# Patient Record
Sex: Male | Born: 2000
Health system: Southern US, Community
[De-identification: ages and names within clinical notes are randomized; demographics above are authoritative.]

---
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
## 2018-08-14 DIAGNOSIS — Z113 Encounter for screening for infections with a predominantly sexual mode of transmission: Secondary | ICD-10-CM | POA: Diagnosis not present

## 2019-11-18 ENCOUNTER — Telehealth: Payer: Self-pay

## 2019-11-18 NOTE — Telephone Encounter (Signed)
[facility] Night - Client Nonclinical Telephone Record AccessNurse Client [facility] Night - Client Client Site [facility] - Night Contact Type Call Who Is Calling Patient / Member / Family / Caregiver Caller Name Kelii Chittum Phone Number [phone] Patient Name Samuel Chandler Patient DOB 20-May-2001 Call Type Message Only Information Provided Reason for Call Request to Schedule Office Appointment Initial Comment Caller states they are calling to schedule an appointment. Additional Comment Disp. Time Disposition Final User 11/17/2019 5:40:10 PM General Information Provided Yes Carley Hammed Call Closed By: Carley Hammed Transaction Date/Time: 11/17/2019 5:34:05 PM (ET)

## 2019-11-18 NOTE — Telephone Encounter (Signed)
lvm asking pt to call office °

## 2019-12-13 ENCOUNTER — Ambulatory Visit: Payer: BLUE CROSS/BLUE SHIELD | Admitting: Primary Care

## 2019-12-16 ENCOUNTER — Ambulatory Visit: Payer: BLUE CROSS/BLUE SHIELD | Admitting: Primary Care

## 2019-12-16 DIAGNOSIS — Z0289 Encounter for other administrative examinations: Secondary | ICD-10-CM

## 2020-04-14 ENCOUNTER — Emergency Department (HOSPITAL_BASED_OUTPATIENT_CLINIC_OR_DEPARTMENT_OTHER): Payer: BC Managed Care – PPO

## 2020-04-14 ENCOUNTER — Encounter (HOSPITAL_BASED_OUTPATIENT_CLINIC_OR_DEPARTMENT_OTHER): Payer: Self-pay | Admitting: *Deleted

## 2020-04-14 ENCOUNTER — Other Ambulatory Visit: Payer: Self-pay

## 2020-04-14 ENCOUNTER — Emergency Department (HOSPITAL_BASED_OUTPATIENT_CLINIC_OR_DEPARTMENT_OTHER)
Admission: EM | Admit: 2020-04-14 | Discharge: 2020-04-14 | Disposition: A | Discharge status: Home / Self Care | Payer: BC Managed Care – PPO | Attending: Emergency Medicine | Admitting: Emergency Medicine

## 2020-04-14 DIAGNOSIS — R52 Pain, unspecified: Secondary | ICD-10-CM

## 2020-04-14 DIAGNOSIS — M79605 Pain in left leg: Secondary | ICD-10-CM | POA: Diagnosis not present

## 2020-04-14 NOTE — ED Provider Notes (Signed)
[facility] EMERGENCY DEPARTMENT Provider Note   CSN: [id number] Arrival date & time: 04/14/20  1803     History Chief Complaint  Patient presents with  . Leg Pain    Samuel Chandler is a 19 y.o. male presents for evaluation of left lower extremity pain that has been ongoing for the last several months.  Patient reports that several months ago, he started noticing pain in his left leg.  He states that it is hard to describe but he feels like "the leg constantly needs to be stretched."  He states that he will feel pain in his thigh as well into the knee and upper tib-fib area.  He states that he has not noticed any overlying warmth, erythema, edema.  He denies any preceding trauma, injury, fall.  He states that he still been able to ambulate.  He notes that this pain is worse when he tries to exercise.  He has not sought evaluation for this yet.  He states that the pain persisted which is what brought him to the emergency department today.  He has not taken a medications.  He states that occasionally, he will get tingling in the leg but states that he never has any full numbness to where he cannot feel anything.  He has not had any fevers.  He currently denies any pain in his left arm.  No weakness. He denies any exogenous hormone use, recent immobilization, prior history of DVT/PE, recent surgery, leg swelling, or long travel.  The history is provided by the patient.       History reviewed. No pertinent past medical history.  There are no problems to display for this patient.   History reviewed. No pertinent surgical history.     No family history on file.  Social History   Tobacco Use  . Smoking status: Never Smoker  . Smokeless tobacco: Never Used  Substance Use Topics  . Alcohol use: Yes    Comment: occ  . Drug use: Yes    Types: Marijuana    Home Medications Prior to Admission medications   Medication Sig Start Date End Date Taking? Authorizing Provider    cephALEXin (KEFLEX) 500 MG capsule Take 1 capsule (500 mg total) by mouth 3 (three) times daily. 08/28/17   Rolland Porter, MD  HYDROcodone-acetaminophen (NORCO/VICODIN) 5-325 MG tablet Take 1 tablet by mouth every 4 (four) hours as needed. 08/28/17   Rolland Porter, MD    Allergies    Patient has no known allergies.  Review of Systems   Review of Systems  Constitutional: Negative for fever.  Cardiovascular: Negative for leg swelling.  Musculoskeletal:       LLE pain  Skin: Negative for color change.  Neurological: Negative for weakness and numbness.  All other systems reviewed and are negative.   Physical Exam Updated Vital Signs BP 118/73   Pulse 71   Temp 98.2 F (36.8 C) (Oral)   Resp 16   Ht 5\' 10"  (1.778 m)   Wt 62.4 kg   SpO2 99%   BMI 19.74 kg/m   Physical Exam Vitals and nursing note reviewed.  Constitutional:      Appearance: He is well-developed.  HENT:     Head: Normocephalic and atraumatic.  Eyes:     General: No scleral icterus.       Right eye: No discharge.        Left eye: No discharge.     Conjunctiva/sclera: Conjunctivae normal.  Cardiovascular:  Pulses:          Dorsalis pedis pulses are 2+ on the right side and 2+ on the left side.  Pulmonary:     Effort: Pulmonary effort is normal.  Musculoskeletal:     Comments: No pelvic instability.  No acute bony tenderness noted to the left hip.  Full flexion/tension and internal/external rotation of left hip intact with any difficulty.  Diffuse muscular tenderness noted to the left thigh, left  proximal tib-fib.  No deformity or crepitus noted.  Flexion/tension of left knee intact without difficulty.  Negative anterior and posterior drawer test.  No instability noted with varus or valgus stress.  No bony tenderness noted distal tib-fib, ankle.  No tenderness palpation of right lower extremity.  No tenderness palpation of bilateral upper extremities.  Skin:    General: Skin is warm and dry.     Comments: Good  distal cap refill.  LLE is not dusky in appearance or cool to touch.  Neurological:     Mental Status: He is alert.     Comments: Cranial nerves III-XII intact Follows commands, Moves all extremities  5/5 strength to BUE and BLE  Sensation intact throughout all major nerve distributions No gait abnormalities  No slurred speech. No facial droop.   Psychiatric:        Speech: Speech normal.        Behavior: Behavior normal.     ED Results / Procedures / Treatments   Labs (all labs ordered are listed, but only abnormal results are displayed) Labs Reviewed - No data to display  EKG None  Radiology DG FEMUR MIN 2 VIEWS LEFT  Result Date: 04/14/2020 CLINICAL DATA:  Intermittent atraumatic left thigh discomfort x several months. EXAM: LEFT FEMUR 2 VIEWS COMPARISON:  None. FINDINGS: There is no evidence of fracture or other focal bone lesions. Soft tissues are unremarkable. IMPRESSION: Negative. Electronically Signed   By: Aram Candela M.D.   On: 04/14/2020 23:00    Procedures Procedures (including critical care time)  Medications Ordered in ED Medications - No data to display  ED Course  I have reviewed the triage vital signs and the nursing notes.  Pertinent labs & imaging results that were available during my care of the patient were reviewed by me and considered in my medical decision making (see chart for details).    MDM Rules/Calculators/A&P                          19 year old male who presents for evaluation of left leg pain that has been ongoing for several months.  He states that he feels like "something is pulling on his muscle."  No trauma, injury, fall.  He has been able to ambulate.  He states that that sometimes it hurts in the thigh, and the hip is about on the top part of his knee.  He has not sought evaluation for this.  On initially arrival, he is afebrile nontoxic-appearing.  Vital signs are stable.  He is neurovascularly intact.  On exam, he has diffuse  muscular tenderness in the thigh region.  No deformity or crepitus noted.  No evidence of warmth, erythema.  Exam not concerning for septic arthritis, ischemic limb.  Doubt DVT.  He does not have any DVT risk factors.  He states that he is concerned for a blood clot.  We will plan for x-rays.  Patient has no evidence of neurological deficits.  He has no gait  abnormalities.  History/local exam not concerning for CVA or intracranial abnormality.  No negation for CT head.  X-ray reviewed.  Negative for any acute bony abnormality.  Discussed results with patient.  At this time, patient is hemodynamically stable.  He is concerned about a blood clot.  At this time, we do not have ultrasound available at this facility.  I did discuss with him that he is low risk and does not have any signs concerning for a DVT.  Will order an outpatient ultrasound for further evaluation.  I discussed with him that there still could be a musculoskeletal/ligamentous injury that could be contributing to his symptoms.  We will give him outpatient orthopedics for follow-up. At this time, patient exhibits no emergent life-threatening condition that require further evaluation in ED or admission. Patient had ample opportunity for questions and discussion. All patient's questions were answered with full understanding. Strict return precautions discussed. Patient expresses understanding and agreement to plan.   Portions of this note were generated with Scientist, clinical (histocompatibility and immunogenetics). Dictation errors may occur despite best attempts at proofreading.  Final Clinical Impression(s) / ED Diagnoses Final diagnoses:  Left leg pain    Rx / DC Orders ED Discharge Orders         Ordered    US Venous Img Lower Unilateral Left        04/14/20 2321           Maxwell Caul, PA-C 04/14/20 2331    Milagros Loll, MD 04/17/20 [phone]

## 2020-04-14 NOTE — Discharge Instructions (Signed)
You can take Tylenol or Ibuprofen as directed for pain. You can alternate Tylenol and Ibuprofen every 4 hours. If you take Tylenol at 1pm, then you can take Ibuprofen at 5pm. Then you can take Tylenol again at 9pm.   As discussed, your x-rays today were reassuring.  As discussed, there still could be muscle, ligament injury.  He can follow-up with referred to orthopedics for further evaluation.  Ordered outpatient ultrasound that can be done tomorrow.  He can return between the hours of between 10 AM-11 AM to o get it done.   Return to emergency department for any worsening pain, numbness/weakness of the leg, redness or swelling of the leg or any other worsening or concerning symptoms.

## 2020-04-14 NOTE — ED Triage Notes (Signed)
Left side of his body has not felt right for several months. Ambulatory in no distress.

## 2020-04-15 ENCOUNTER — Ambulatory Visit (HOSPITAL_BASED_OUTPATIENT_CLINIC_OR_DEPARTMENT_OTHER)
Admission: RE | Admit: 2020-04-15 | Discharge: 2020-04-15 | Disposition: A | Discharge status: Home / Self Care | Payer: BC Managed Care – PPO | Source: Ambulatory Visit | Attending: Emergency Medicine | Admitting: Emergency Medicine

## 2020-04-15 DIAGNOSIS — M79605 Pain in left leg: Secondary | ICD-10-CM | POA: Diagnosis present

## 2020-06-15 ENCOUNTER — Ambulatory Visit: Payer: Self-pay | Admitting: Family Medicine

## 2020-12-12 ENCOUNTER — Ambulatory Visit (HOSPITAL_COMMUNITY): Payer: Self-pay

## 2021-01-28 ENCOUNTER — Other Ambulatory Visit: Payer: Self-pay

## 2021-01-28 ENCOUNTER — Emergency Department (HOSPITAL_COMMUNITY)
Admission: EM | Admit: 2021-01-28 | Discharge: 2021-01-28 | Disposition: A | Discharge status: Home / Self Care | Payer: BC Managed Care – PPO | Attending: Emergency Medicine | Admitting: Emergency Medicine

## 2021-01-28 DIAGNOSIS — S0181XA Laceration without foreign body of other part of head, initial encounter: Secondary | ICD-10-CM | POA: Diagnosis not present

## 2021-01-28 DIAGNOSIS — Z23 Encounter for immunization: Secondary | ICD-10-CM | POA: Insufficient documentation

## 2021-01-28 DIAGNOSIS — W1830XA Fall on same level, unspecified, initial encounter: Secondary | ICD-10-CM | POA: Insufficient documentation

## 2021-01-28 DIAGNOSIS — S0993XA Unspecified injury of face, initial encounter: Secondary | ICD-10-CM | POA: Diagnosis present

## 2021-01-28 MED ORDER — LIDOCAINE-EPINEPHRINE-TETRACAINE (LET) TOPICAL GEL
3.0000 mL | Freq: Once | TOPICAL | Status: AC
  Administered 2021-01-28: 3 mL via TOPICAL
  Filled 2021-01-28: qty 3

## 2021-01-28 MED ORDER — TETANUS-DIPHTH-ACELL PERTUSSIS 5-2.5-18.5 LF-MCG/0.5 IM SUSY
0.5000 mL | PREFILLED_SYRINGE | Freq: Once | INTRAMUSCULAR | Status: AC
  Administered 2021-01-28: 0.5 mL via INTRAMUSCULAR
  Filled 2021-01-28: qty 0.5

## 2021-01-28 MED ORDER — LIDOCAINE HCL (PF) 1 % IJ SOLN
5.0000 mL | Freq: Once | INTRAMUSCULAR | Status: DC
  Filled 2021-01-28: qty 30

## 2021-01-28 NOTE — Discharge Instructions (Addendum)
You were seen in the emergency department today for a laceration. Your laceration was closed with 3 absorbable stitches. Please keep this area clean and dry for the next 24 hours, after 24 hours you may get this area wet, but avoid soaking the area. Keep the area covered as best possible especially when in the sun to help in minimizing scarring.   Your tetanus has been updated  Your stitches are absorbable, they can come out in 5 days, you can apply antibiotic ointment to the stitches and they will gently pull out, until 5 days do not put any ointments/lotions on this area as this could compromise the stitches.  You may also be seen at an urgent care or ER to have these removed.    Return to the ER soon should you start to experience pus type drainage from the wound, redness around the wound, or fevers as this could indicate the area is infected, please return to the ER for any other worsening symptoms or concerns that you may have.

## 2021-01-28 NOTE — ED Provider Notes (Signed)
[facility]-EMERGENCY DEPT Provider Note   CSN: [id number] Arrival date & time: 01/28/21  0302     History Chief Complaint  Patient presents with  . Facial Laceration    Samuel Chandler is a 20 y.o. male without significant past medical hx who presents to the ED with police for evaluation of chin laceration that occurred shortly PTA. Patient states he fell onto his chin resulting in wound, it is sore, no alleviating/aggravating factors. No intervention PTA. Denies LOC or other areas of injury. Unknown last tetanus.   HPI     No past medical history on file.  There are no problems to display for this patient.   No past surgical history on file.     No family history on file.  Social History   Tobacco Use  . Smoking status: Never Smoker  . Smokeless tobacco: Never Used  Substance Use Topics  . Alcohol use: Yes    Comment: occ  . Drug use: Yes    Types: Marijuana    Home Medications Prior to Admission medications   Medication Sig Start Date End Date Taking? Authorizing Provider  cephALEXin (KEFLEX) 500 MG capsule Take 1 capsule (500 mg total) by mouth 3 (three) times daily. 08/28/17   Rolland Porter, MD  HYDROcodone-acetaminophen (NORCO/VICODIN) 5-325 MG tablet Take 1 tablet by mouth every 4 (four) hours as needed. 08/28/17   Rolland Porter, MD    Allergies    Patient has no known allergies.  Review of Systems   Review of Systems  Constitutional: Negative for chills and fever.  Eyes: Negative for visual disturbance.  Respiratory: Negative for shortness of breath.   Cardiovascular: Negative for chest pain.  Gastrointestinal: Negative for abdominal pain.  Musculoskeletal: Negative for back pain and neck pain.  Skin: Positive for wound.  Neurological: Negative for weakness and numbness.  All other systems reviewed and are negative.   Physical Exam Updated Vital Signs BP 118/73 (BP Location: Right Arm)   Pulse (!) 103   Temp 98.4 F (36.9 C)  (Oral)   Resp 16   SpO2 100%   Physical Exam Vitals and nursing note reviewed.  Constitutional:      General: He is not in acute distress.    Appearance: He is well-developed. He is not ill-appearing or toxic-appearing.  HENT:     Head: Normocephalic.      Comments: No palpable facial bony tenderness.  Patient able to open and close his mouth with good alignment. Eyes:     General:        Right eye: No discharge.        Left eye: No discharge.     Extraocular Movements: Extraocular movements intact.     Conjunctiva/sclera: Conjunctivae normal.     Pupils: Pupils are equal, round, and reactive to light.  Cardiovascular:     Rate and Rhythm: Normal rate and regular rhythm.  Pulmonary:     Effort: Pulmonary effort is normal. No respiratory distress.     Breath sounds: Normal breath sounds. No wheezing, rhonchi or rales.  Chest:     Chest wall: No tenderness.  Abdominal:     General: There is no distension.     Palpations: Abdomen is soft.     Tenderness: There is no abdominal tenderness. There is no guarding or rebound.  Musculoskeletal:     Cervical back: Normal range of motion and neck supple. No tenderness.     Comments: Moving upper and lower extremities  at all major joints without focal bony tenderness.  No midline tenderness to the spine.  Skin:    General: Skin is warm and dry.     Findings: No rash.  Neurological:     Mental Status: He is alert.     Comments: Clear speech.  CN III through XII grossly intact.  Sensation and strength grossly intact x4.  Patient is ambulatory.  Psychiatric:        Behavior: Behavior normal.     ED Results / Procedures / Treatments   Labs (all labs ordered are listed, but only abnormal results are displayed) Labs Reviewed - No data to display  EKG None  Radiology No results found.  Procedures .Marland KitchenLaceration Repair  Date/Time: 01/28/2021 4:50 AM Performed by: Cherly Anderson, PA-C Authorized by: Cherly Anderson,  PA-C   Consent:    Consent obtained:  Verbal   Consent given by:  Patient   Risks, benefits, and alternatives were discussed: yes     Risks discussed:  Infection, nerve damage, need for additional repair, pain, poor cosmetic result, poor wound healing, tendon damage, retained foreign body and vascular damage   Alternatives discussed:  No treatment Anesthesia:    Anesthesia method:  Topical application   Topical anesthetic:  LET Laceration details:    Location:  Face   Face location:  Chin   Length (cm):  2.5   Depth (mm):  3 Pre-procedure details:    Preparation:  Patient was prepped and draped in usual sterile fashion Exploration:    Hemostasis achieved with:  Direct pressure   Wound exploration: wound explored through full range of motion and entire depth of wound visualized   Treatment:    Area cleansed with:  Chlorhexidine   Amount of cleaning:  Standard   Irrigation solution:  Sterile water   Irrigation method:  Pressure wash   Debridement:  None   Undermining:  None Skin repair:    Repair method:  Sutures   Suture size:  5-0   Wound skin closure material used: vicryl rapide.   Suture technique:  Simple interrupted   Number of sutures:  3 Approximation:    Approximation:  Close Repair type:    Repair type:  Simple Post-procedure details:    Procedure completion:  Tolerated well, no immediate complications     Medications Ordered in ED Medications  lidocaine (PF) (XYLOCAINE) 1 % injection 5 mL (has no administration in time range)  lidocaine-EPINEPHrine-tetracaine (LET) topical gel (3 mLs Topical Given 01/28/21 0347)  Tdap (BOOSTRIX) injection 0.5 mL (0.5 mLs Intramuscular Given 01/28/21 0347)    ED Course  I have reviewed the triage vital signs and the nursing notes.  Pertinent labs & imaging results that were available during my care of the patient were reviewed by me and considered in my medical decision making (see chart for details).    MDM  Rules/Calculators/A&P                         Patient presents to the emergency department for evaluation of facial laceration which occurred status post fall shortly prior to arrival.  He is nontoxic, resting comfortably, initial tachycardia resolved on my exam.  We was pressure irrigated and visualized in a bloodless field without evidence of foreign body.  No underlying significant tenderness to palpation, able to fully open and close his jaw without difficulty, I do not suspect underlying fracture.  Wound was repaired per procedure note above.  Tolerated well.  Tetanus updated.  Discussed wound care.   I discussed treatment plan, need for follow-up, and return precautions with the patient. Provided opportunity for questions, patient confirmed understanding and is in agreement with plan.   Final Clinical Impression(s) / ED Diagnoses Final diagnoses:  Chin laceration, initial encounter    Rx / DC Orders ED Discharge Orders    None       Cherly Anderson, PA-C 01/28/21 0523    Gilda Crease, MD 01/28/21 [phone]

## 2021-01-28 NOTE — ED Triage Notes (Signed)
Pt in custody. Brought in with a chin lac that the jail staff needs stitched prior to accepting him. Unknown tetanus.

## 2021-02-02 ENCOUNTER — Ambulatory Visit
Admission: EM | Admit: 2021-02-02 | Discharge: 2021-02-02 | Disposition: A | Discharge status: Home / Self Care | Payer: BC Managed Care – PPO | Attending: Emergency Medicine | Admitting: Emergency Medicine

## 2021-02-02 ENCOUNTER — Other Ambulatory Visit: Payer: Self-pay

## 2021-02-02 ENCOUNTER — Encounter: Payer: Self-pay | Admitting: Emergency Medicine

## 2021-02-02 DIAGNOSIS — S0993XS Unspecified injury of face, sequela: Secondary | ICD-10-CM

## 2021-02-02 DIAGNOSIS — R202 Paresthesia of skin: Secondary | ICD-10-CM

## 2021-02-02 MED ORDER — IBUPROFEN 800 MG PO TABS: 800.0000 mg | ORAL_TABLET | Freq: Three times a day (TID) | ORAL | 0 refills | Status: AC

## 2021-02-02 MED ORDER — TIZANIDINE HCL 2 MG PO TABS: 2.0000 mg | ORAL_TABLET | Freq: Four times a day (QID) | ORAL | 0 refills | Status: AC | PRN

## 2021-02-02 NOTE — Discharge Instructions (Addendum)
Use anti-inflammatories for pain/swelling. You may take up to 800 mg Ibuprofen every 8 hours with food. You may supplement Ibuprofen with Tylenol [phone] mg every 8 hours.  Supplement tizanidine at home/bedtime for jaw pain/neck pain Alternate ice and heat Please follow-up if not improving or worsening

## 2021-02-02 NOTE — ED Provider Notes (Signed)
[facility]    CSN: [id number] Arrival date & time: 02/02/21  1834      History   Chief Complaint Chief Complaint  Patient presents with   Facial Pain    HPI Samuel Chandler is a 20 y.o. male presenting today for evaluation of facial burning.  Approximately 1 week ago patient was running from the police tripped and fell and injured his chin.  Was seen in the emergency room after and had laceration repaired to chin.  He reports over the past week he has noticed a burning sensation and discomfort to his lower jaw, symptoms will wax and wane with certain movement, but overall constant.  Causing associated headaches.  Denies difficulty talking or eating.  HPI  History reviewed. No pertinent past medical history.  There are no problems to display for this patient.   History reviewed. No pertinent surgical history.     Home Medications    Prior to Admission medications   Medication Sig Start Date End Date Taking? Authorizing Provider  ibuprofen (ADVIL) 800 MG tablet Take 1 tablet (800 mg total) by mouth 3 (three) times daily. 02/02/21  Yes Caprisha Bridgett C, PA-C  tiZANidine (ZANAFLEX) 2 MG tablet Take 1-2 tablets (2-4 mg total) by mouth every 6 (six) hours as needed for muscle spasms. 02/02/21  Yes Carlton Buskey, Junius Creamer, PA-C    Family History History reviewed. No pertinent family history.  Social History Social History   Tobacco Use   Smoking status: Never   Smokeless tobacco: Never  Substance Use Topics   Alcohol use: Yes    Comment: occ   Drug use: Yes    Types: Marijuana     Allergies   Patient has no known allergies.   Review of Systems Review of Systems  Constitutional:  Negative for fatigue and fever.  Eyes:  Negative for redness, itching and visual disturbance.  Respiratory:  Negative for shortness of breath.   Cardiovascular:  Negative for chest pain and leg swelling.  Gastrointestinal:  Negative for nausea and vomiting.  Musculoskeletal:   Positive for arthralgias. Negative for myalgias.  Skin:  Negative for color change, rash and wound.  Neurological:  Negative for dizziness, syncope, weakness, light-headedness and headaches.    Physical Exam Triage Vital Signs ED Triage Vitals  Enc Vitals Group     BP      Pulse      Resp      Temp      Temp src      SpO2      Weight      Height      Head Circumference      Peak Flow      Pain Score      Pain Loc      Pain Edu?      Excl. in GC?    No data found.  Updated Vital Signs BP 127/83 (BP Location: Right Arm)   Pulse 72   Temp 98.5 F (36.9 C) (Oral)   Resp 14   SpO2 98%   Visual Acuity Right Eye Distance:   Left Eye Distance:   Bilateral Distance:    Right Eye Near:   Left Eye Near:    Bilateral Near:     Physical Exam Vitals and nursing note reviewed.  Constitutional:      Appearance: He is well-developed.     Comments: No acute distress  HENT:     Head: Normocephalic and atraumatic.  Ears:     Comments: Bilateral ears without tenderness to palpation of external auricle, tragus and mastoid, EAC's without erythema or swelling, TM's with good bony landmarks and cone of light. Non erythematous.  No hemotympanum    Nose: Nose normal.     Comments: Bilateral nares patent    Mouth/Throat:     Comments: Oral mucosa pink and moist, no tonsillar enlargement or exudate. Posterior pharynx patent and nonerythematous, no uvula deviation or swelling. Normal phonation.   Full active range of motion of jaw Nontender to palpation of her TMJ, slight tenderness to palpation near mandibular angle Eyes:     Conjunctiva/sclera: Conjunctivae normal.  Cardiovascular:     Rate and Rhythm: Normal rate.  Pulmonary:     Effort: Pulmonary effort is normal. No respiratory distress.     Comments: Breathing comfortably at rest, CTABL, no wheezing, rales or other adventitious sounds auscultated  Abdominal:     General: There is no distension.  Musculoskeletal:         General: Normal range of motion.     Cervical back: Neck supple.     Comments: Tenderness to palpation into left cervical area  Skin:    General: Skin is warm and dry.  Neurological:     Mental Status: He is alert and oriented to person, place, and time.     UC Treatments / Results  Labs (all labs ordered are listed, but only abnormal results are displayed) Labs Reviewed - No data to display  EKG   Radiology No results found.  Procedures Procedures (including critical care time)  Medications Ordered in UC Medications - No data to display  Initial Impression / Assessment and Plan / UC Course  I have reviewed the triage vital signs and the nursing notes.  Pertinent labs & imaging results that were available during my care of the patient were reviewed by me and considered in my medical decision making (see chart for details).     Jaw pain after fall-full active range of motion, low suspicion of underlying fracture, burning sensation suggestive of possible underlying nerve injury, also with associated cervical tenderness, treating with intent for muscle relaxers and close monitoring.  Discussed strict return precautions. Patient verbalized understanding and is agreeable with plan.  Final Clinical Impressions(s) / UC Diagnoses   Final diagnoses:  Facial injury, sequela  Paresthesia     Discharge Instructions      Use anti-inflammatories for pain/swelling. You may take up to 800 mg Ibuprofen every 8 hours with food. You may supplement Ibuprofen with Tylenol [phone] mg every 8 hours.  Supplement tizanidine at home/bedtime for jaw pain/neck pain Alternate ice and heat Please follow-up if not improving or worsening     ED Prescriptions     Medication Sig Dispense Auth. Provider   ibuprofen (ADVIL) 800 MG tablet Take 1 tablet (800 mg total) by mouth 3 (three) times daily. 21 tablet Beretta Ginsberg C, PA-C   tiZANidine (ZANAFLEX) 2 MG tablet Take 1-2 tablets  (2-4 mg total) by mouth every 6 (six) hours as needed for muscle spasms. 30 tablet Revia Nghiem, [place] C, PA-C      PDMP not reviewed this encounter.   Lew Dawes, [place] 02/03/21 [phone]

## 2021-02-02 NOTE — ED Triage Notes (Signed)
Patient c/o LFT sided facial pain x 1 week.   Patient states onset of symptoms began after " falling on face".   Patient endorses headache at times.   Patient states " pain feels like a burning sensation". Patient states movement and talking makes pain worst.   Patient hasn't taken any medications for symptoms.

## 2021-03-04 IMAGING — US US EXTREM LOW VENOUS*L*
1 series · 13 of 24 positions shown · non-contrast
Comparison: None.

CLINICAL DATA: Left knee and hip pain



[Series 1: us extrem low venous*left* · 13 of 32 slices shown]
[im 1/32]
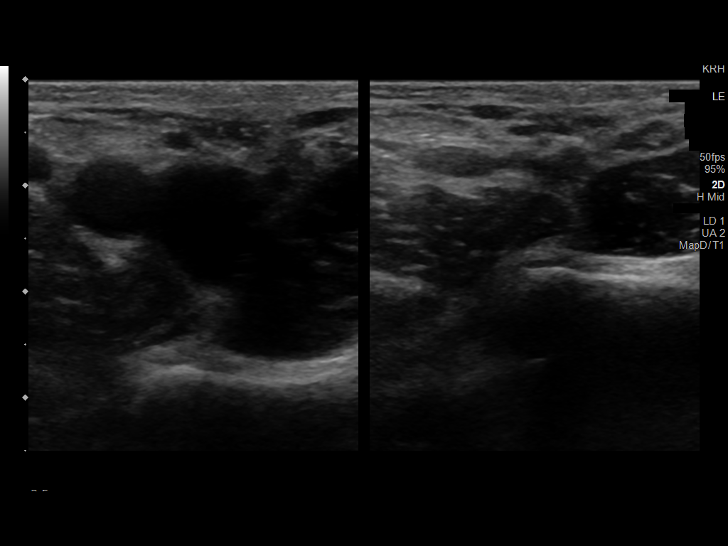
[im 3/32]
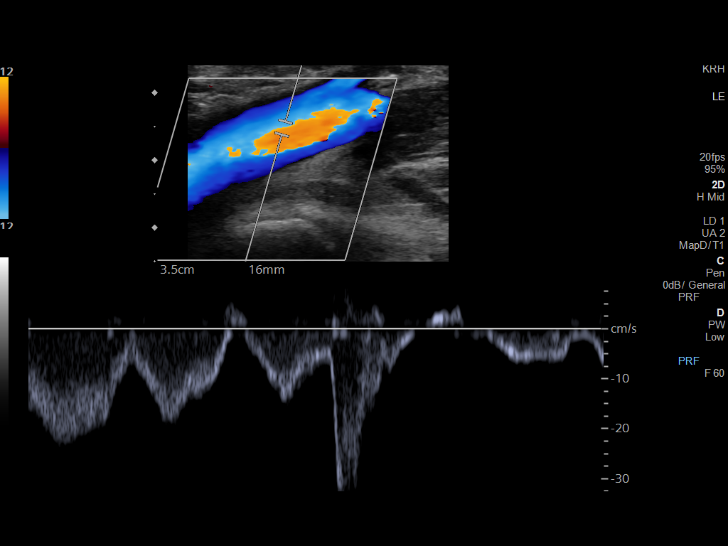
[im 6/32]
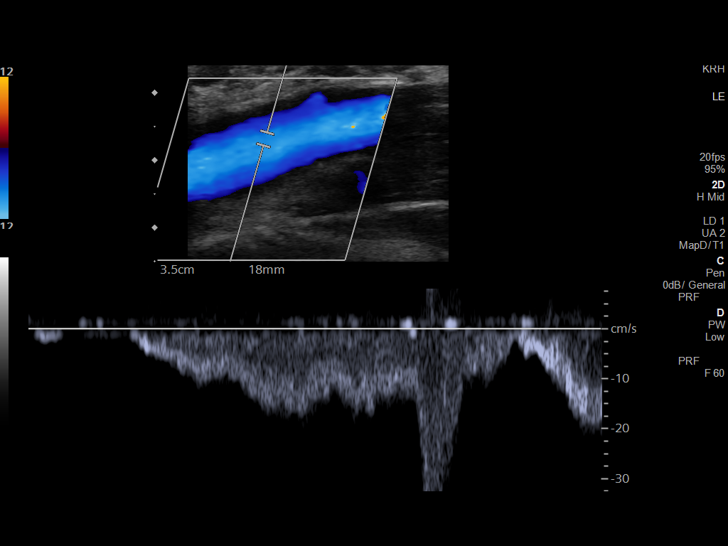
[im 9/32]
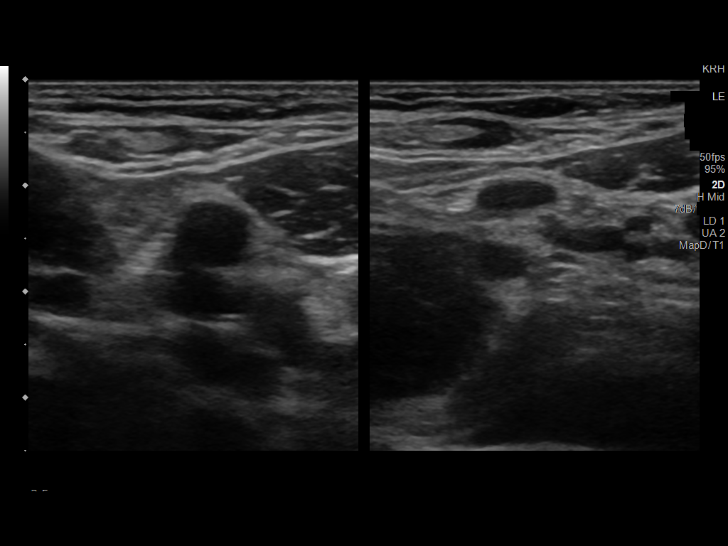
[im 11/32]
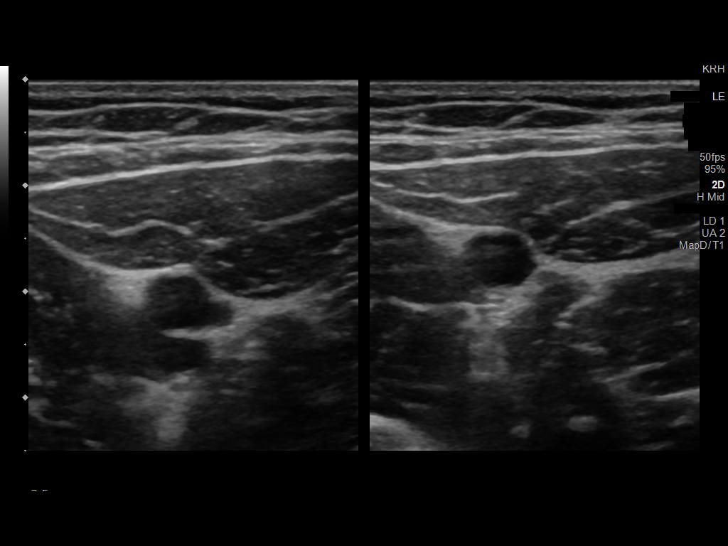
[im 14/32]
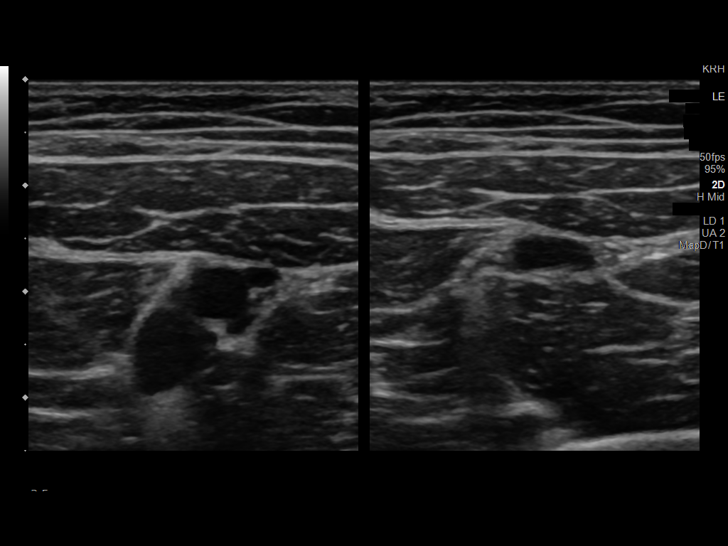
[im 17/32]
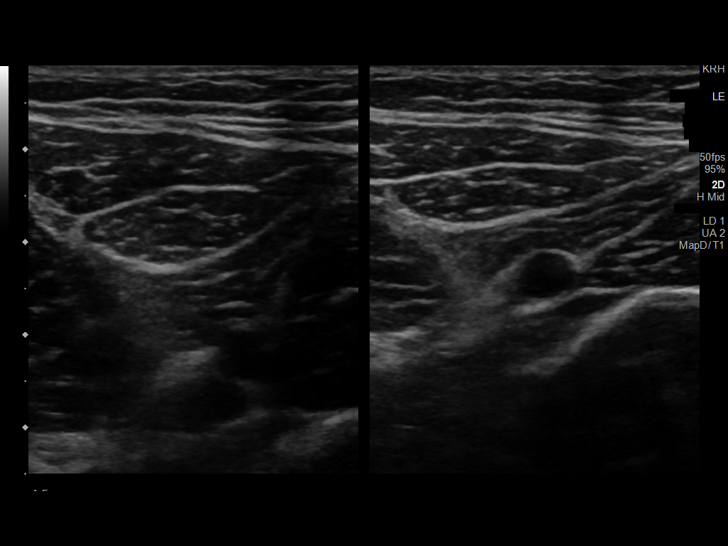
[im 18/32]
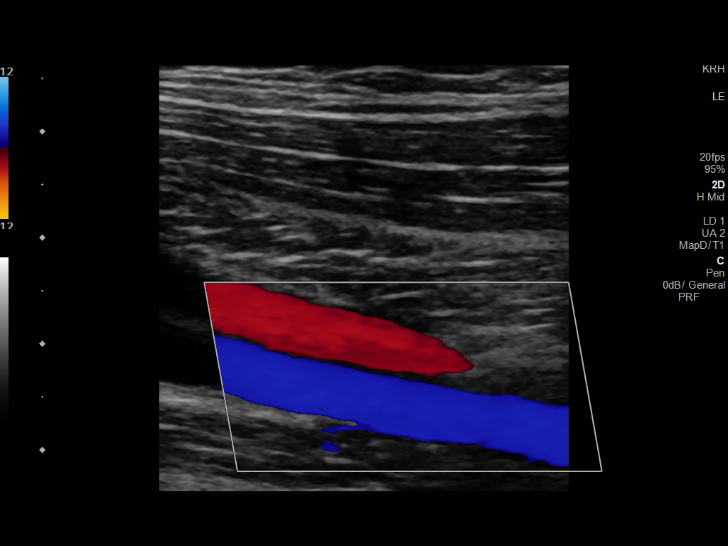
[im 21/32]
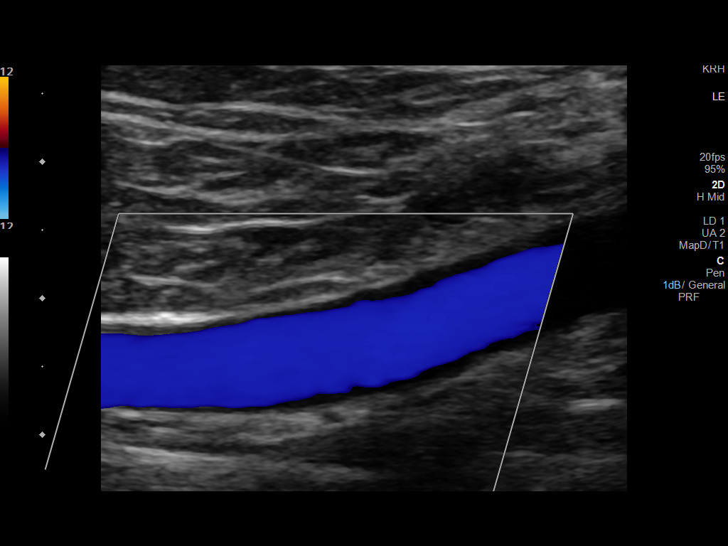
[im 23/32]
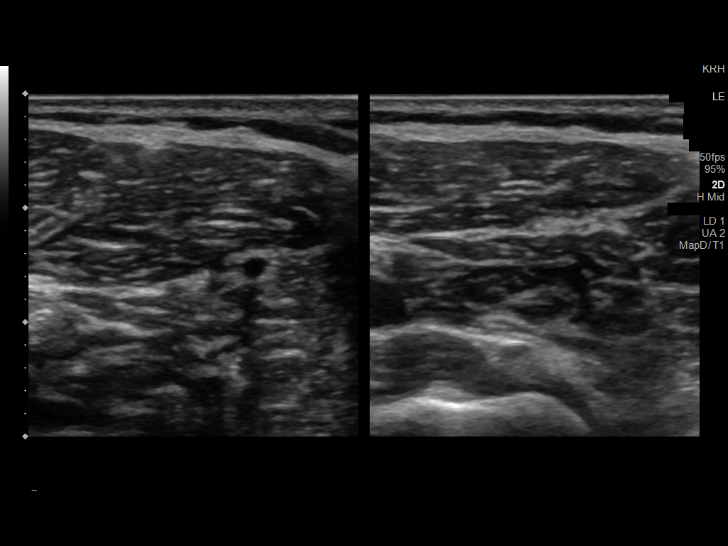
[im 26/32]
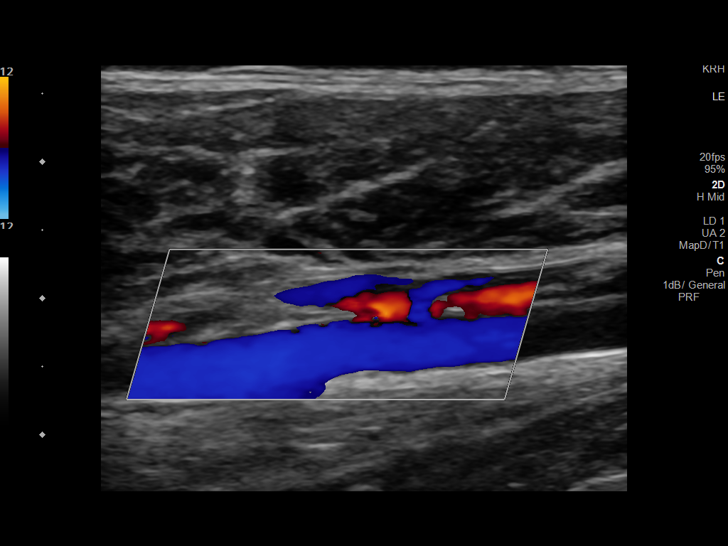
[im 29/32]
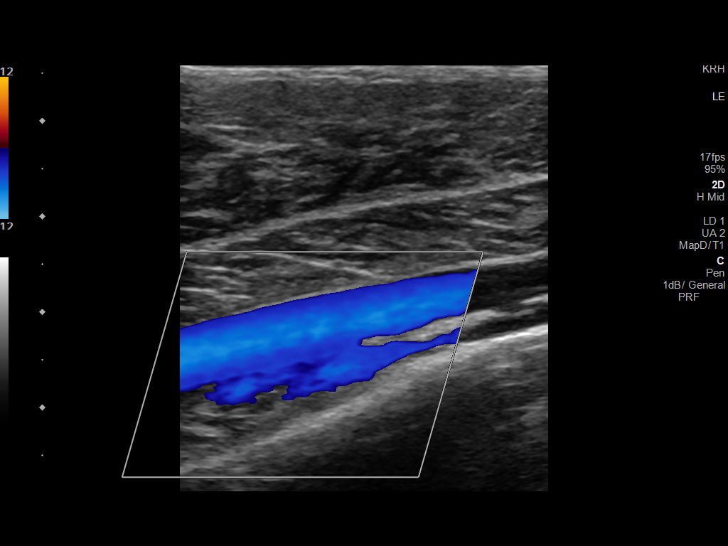
[im 32/32]
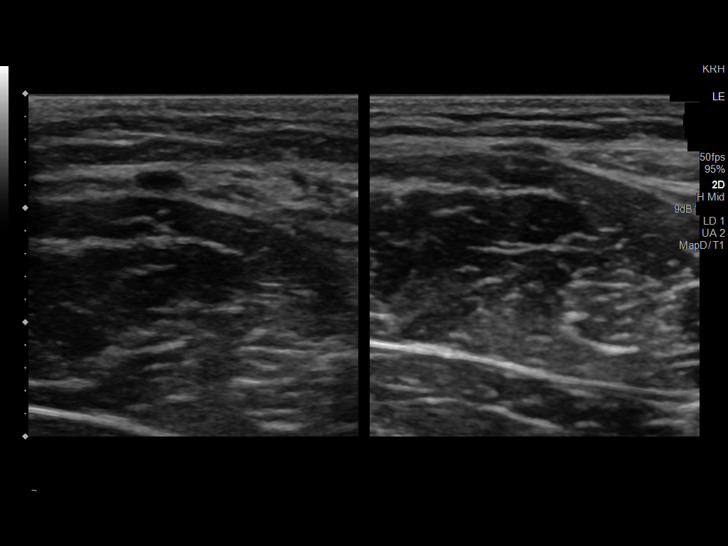

[13 of 24 positions shown; findings below may reference images not displayed]

FINDINGS: Contralateral Common Femoral Vein: Respiratory phasicity is normal
and symmetric with the symptomatic side. No evidence of thrombus.
Normal compressibility.

Common Femoral Vein: No evidence of thrombus. Normal
compressibility, respiratory phasicity and response to augmentation.

Saphenofemoral Junction: No evidence of thrombus. Normal
compressibility and flow on color Doppler imaging.

Profunda Femoral Vein: No evidence of thrombus. Normal
compressibility and flow on color Doppler imaging.

Femoral Vein: No evidence of thrombus. Normal compressibility,
respiratory phasicity and response to augmentation.

Popliteal Vein: No evidence of thrombus. Normal compressibility,
respiratory phasicity and response to augmentation.

Calf Veins: No evidence of thrombus. Normal compressibility and flow
on color Doppler imaging.
IMPRESSION: No evidence of deep venous thrombosis.

## 2024-10-01 ENCOUNTER — Emergency Department
Admission: EM | Admit: 2024-10-01 | Discharge: 2024-10-01 | Disposition: A | Discharge status: Home / Self Care | Source: Home / Self Care

## 2024-10-01 ENCOUNTER — Emergency Department

## 2024-10-01 ENCOUNTER — Other Ambulatory Visit: Payer: Self-pay

## 2024-10-01 DIAGNOSIS — S62316A Displaced fracture of base of fifth metacarpal bone, right hand, initial encounter for closed fracture: Secondary | ICD-10-CM

## 2024-10-01 NOTE — Discharge Instructions (Addendum)
 Keep your splint clean and dry, do not get it wet.  Please follow-up with the hand surgeon for recheck.  Please return for any new, worsening, or those or other concerns.

## 2024-10-01 NOTE — ED Notes (Signed)
 This RN triaged pt and wrote triage note

## 2024-10-01 NOTE — ED Triage Notes (Signed)
 Pt Presents to the ED POV due to right hand pain. Pt states he punched a wall with a closed fist this morning.  Pt endorses wrist and hand pain.

## 2024-10-01 NOTE — ED Provider Notes (Signed)
 "  [facility] Provider Note    Event Date/Time   First MD Initiated Contact with Patient 10/01/24 1233     (approximate)   History   Hand Pain   HPI  Samuel Chandler is a 24 y.o. male right-hand-dominant who presents today for evaluation of hand pain after he punched something this morning.  He reports that he thought that it was a pile of close, but there was actually something hard inside.  He denies numbness or tingling.  He reports that he has pain to the lateral aspect of his hand and feels that this area is swollen.  There are no active problems to display for this patient.         Physical Exam   Triage Vital Signs: ED Triage Vitals [10/01/24 1219]  Encounter Vitals Group     BP 104/81     Girls Systolic BP Percentile      Girls Diastolic BP Percentile      Boys Systolic BP Percentile      Boys Diastolic BP Percentile      Pulse Rate 72     Resp 18     Temp 98.6 F (37 C)     Temp Source Oral     SpO2 100 %     Weight      Height 6' (1.829 m)     Head Circumference      Peak Flow      Pain Score 9     Pain Loc      Pain Education      Exclude from Growth Chart     Most recent vital signs: Vitals:   10/01/24 1219  BP: 104/81  Pulse: 72  Resp: 18  Temp: 98.6 F (37 C)  SpO2: 100%    Physical Exam Vitals and nursing note reviewed.  Constitutional:      General: Awake and alert. No acute distress.    Appearance: Normal appearance. The patient is normal weight.  HENT:     Head: Normocephalic and atraumatic.     Mouth: Mucous membranes are moist.  Eyes:     General: PERRL. Normal EOMs        Right eye: No discharge.        Left eye: No discharge.     Conjunctiva/sclera: Conjunctivae normal.  Cardiovascular:     Rate and Rhythm: Normal rate and regular rhythm.     Pulses: Normal pulses.  Pulmonary:     Effort: Pulmonary effort is normal. No respiratory distress.     Breath sounds: Normal breath sounds.  Abdominal:      Abdomen is soft. There is no abdominal tenderness. No rebound or guarding. No distention. Musculoskeletal:        General: No swelling. Normal range of motion.     Cervical back: Normal range of motion and neck supple.  Mild swelling over the fifth metacarpal, with ecchymosis noted to the lateral palm.  Tenderness to palpation in this location.  No malrotation noted.  Normal intrinsic muscle function of the hand.  Normal radial pulse.  No tenderness to wrist, elbow, or shoulder. Skin:    General: Skin is warm and dry.     Capillary Refill: Capillary refill takes less than 2 seconds.     Findings: No rash.  Neurological:     Mental Status: The patient is awake and alert.      ED Results / Procedures / Treatments   Labs (all  labs ordered are listed, but only abnormal results are displayed) Labs Reviewed - No data to display   EKG     RADIOLOGY I independently reviewed and interpreted imaging and agree with radiologists findings.     PROCEDURES:  Critical Care performed:   .Splint Application  Date/Time: 10/01/2024 2:35 PM  Performed by: Willliam Pettet E, PA-C Authorized by: Azarah Dacy E, PA-C   Consent:    Consent obtained:  Verbal   Consent given by:  Patient   Risks, benefits, and alternatives were discussed: yes     Risks discussed:  Discoloration, numbness, swelling and pain   Alternatives discussed:  No treatment Universal protocol:    Procedure explained and questions answered to patient or proxy's satisfaction: yes     Relevant documents present and verified: yes     Test results available: yes     Imaging studies available: yes     Required blood products, implants, devices, and special equipment available: yes     Site/side marked: yes     Immediately prior to procedure a time out was called: yes     Patient identity confirmed:  Verbally with patient Pre-procedure details:    Distal neurologic exam:  Normal   Distal perfusion: distal pulses strong  and brisk capillary refill   Procedure details:    Location:  Hand   Hand location:  R hand   Strapping: no     Cast type:  Short arm   Splint type:  Ulnar gutter   Supplies:  Elastic bandage, cotton padding and fiberglass   Attestation: Splint applied and adjusted personally by me   Post-procedure details:    Distal neurologic exam:  Normal   Distal perfusion: distal pulses strong and brisk capillary refill     Procedure completion:  Tolerated well, no immediate complications    MEDICATIONS ORDERED IN ED: Medications - No data to display   IMPRESSION / MDM / ASSESSMENT AND PLAN / ED COURSE  I reviewed the triage vital signs and the nursing notes.   Differential diagnosis includes, but is not limited to, boxer fracture, dislocation, contusion  Patient is awake and alert, hemodynamically stable and afebrile.  He has normal radial pulse, no malrotation on exam, normal intrinsic muscle function of the hand.  X-ray obtained reveals probable mildly displaced fracture involving the proximal base of the fifth metacarpal.  Discussed this finding with the patient and recommend ulnar gutter splint.  Patient is in agreement with plan.  I recommended that patient follow-up with hand surgery and the appropriate follow-up information was provided.  We also discussed splint care in the meantime.  Patient understands and agrees with plan.  Discharged in stable condition  Patient's presentation is most consistent with acute complicated illness / injury requiring diagnostic workup.      FINAL CLINICAL IMPRESSION(S) / ED DIAGNOSES   Final diagnoses:  Closed displaced fracture of base of fifth metacarpal bone of right hand, initial encounter     Rx / DC Orders   ED Discharge Orders     None        Note:  This document was prepared using Dragon voice recognition software and may include unintentional dictation errors.   Maleek Craver E, PA-C 10/01/24 1503    Mulvihill, Caitlin M,  MD 10/01/24 1523  "
